# Patient Record
Sex: Female | Born: 1990 | Race: White | Hispanic: No | Marital: Single
Health system: Southern US, Community
[De-identification: ages and names within clinical notes are randomized; demographics above are authoritative.]

---

## 2014-06-07 ENCOUNTER — Other Ambulatory Visit: Payer: Self-pay | Admitting: Family Medicine

## 2014-06-07 DIAGNOSIS — R1011 Right upper quadrant pain: Secondary | ICD-10-CM

## 2014-06-17 ENCOUNTER — Ambulatory Visit
Admission: RE | Admit: 2014-06-17 | Discharge: 2014-06-17 | Disposition: A | Discharge status: Home / Self Care | Payer: BC Managed Care – PPO | Source: Ambulatory Visit | Attending: Family Medicine | Admitting: Family Medicine

## 2014-06-17 DIAGNOSIS — R1011 Right upper quadrant pain: Secondary | ICD-10-CM

## 2015-03-17 IMAGING — US US ABDOMEN COMPLETE
1 series · 14 of 25 positions shown · non-contrast
Comparison: None.

CLINICAL DATA: Intermittent right upper quadrant abdominal pain for
15 years, increasing over the past 3 years. Diagnosed with irritable
bowel syndrome in 5955. Nausea and vomiting and pain after eating.

EXAM:
ULTRASOUND ABDOMEN COMPLETE

[Series 1: us abdomen complete · 0.26mm/px · 14 of 72 slices shown]
[im 1/72]
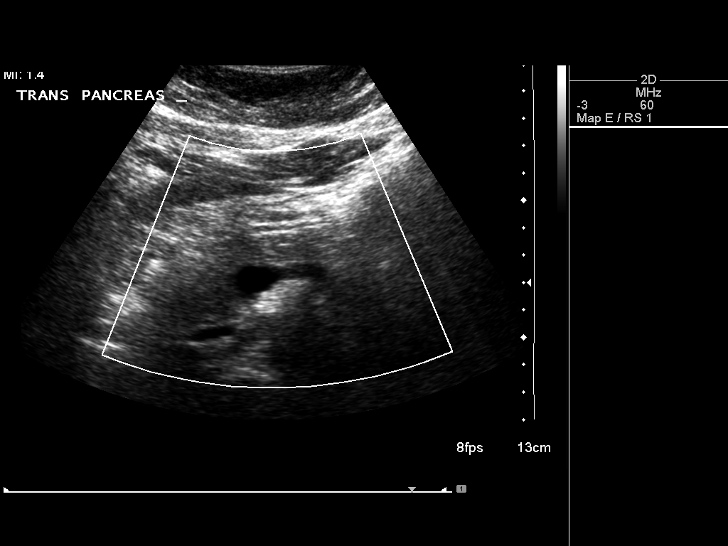
[im 6/72]
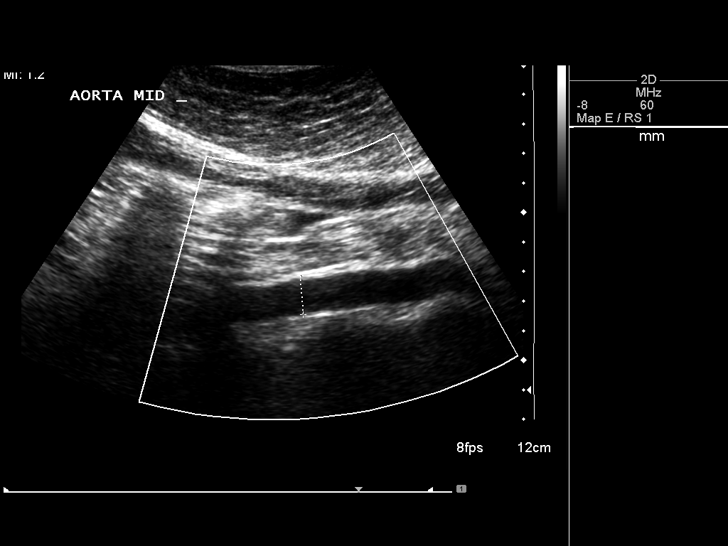
[im 12/72]
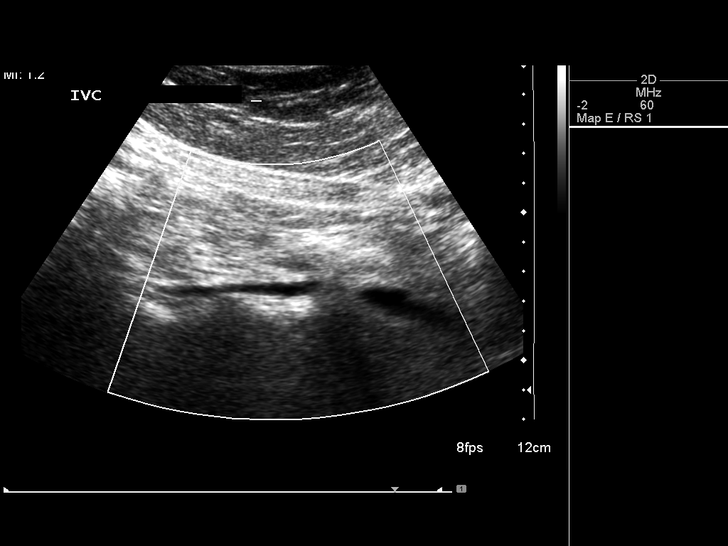
[im 18/72]
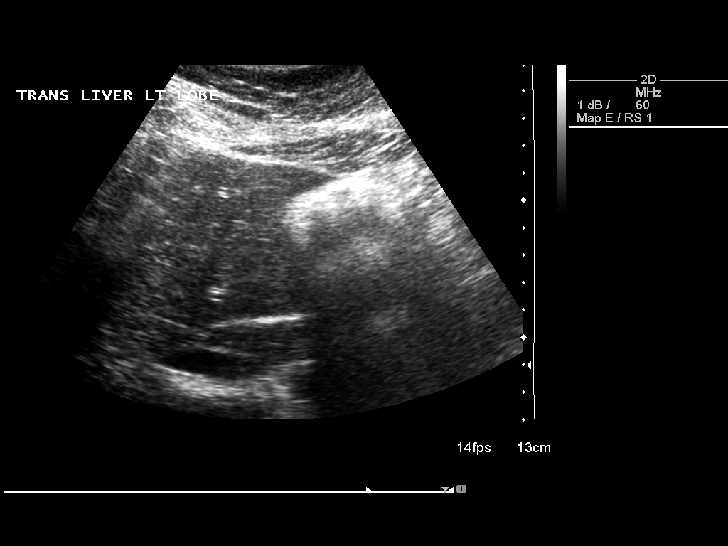
[im 24/72]
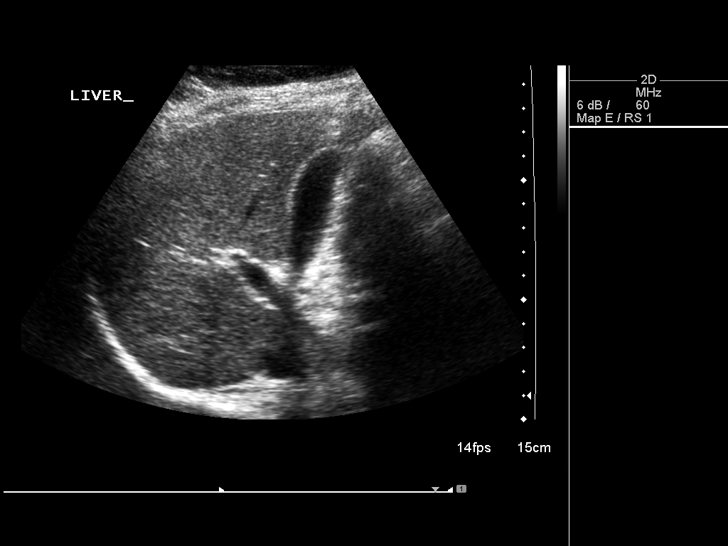
[im 27/72]
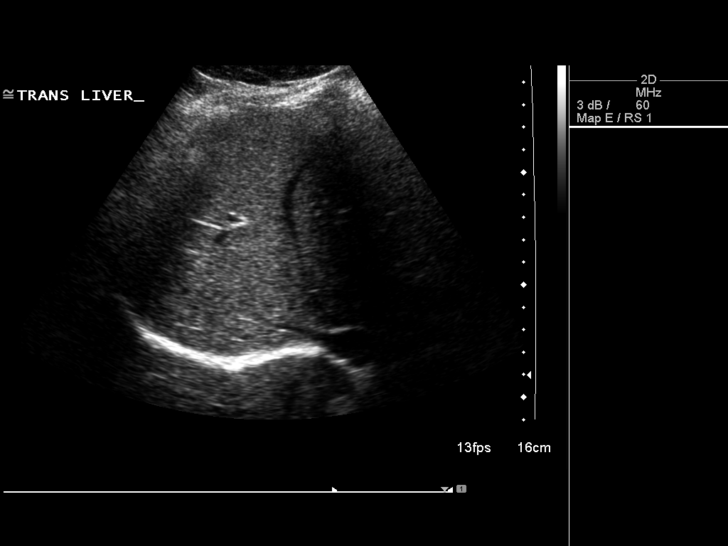
[im 33/72]
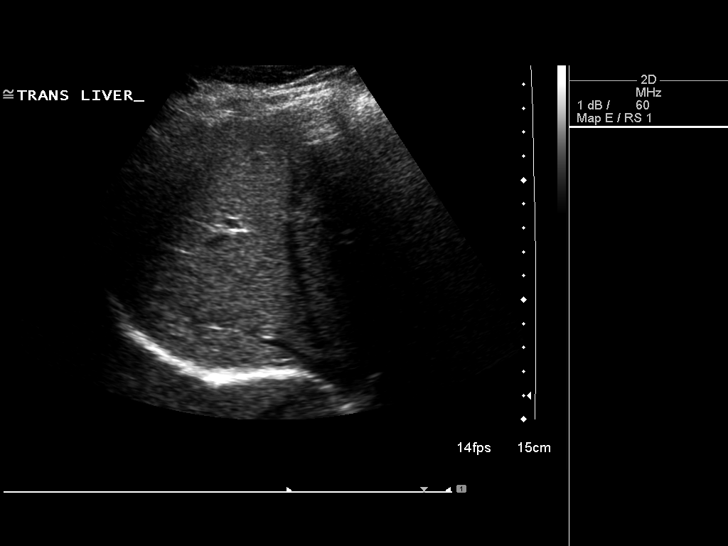
[im 39/72]
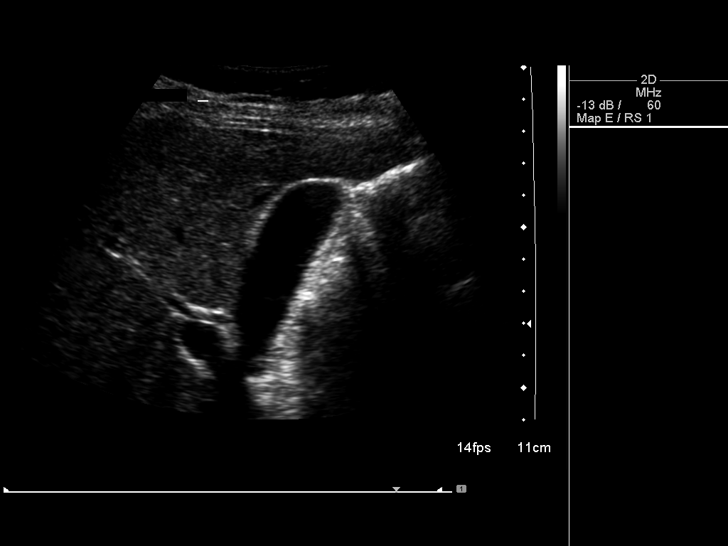
[im 45/72]
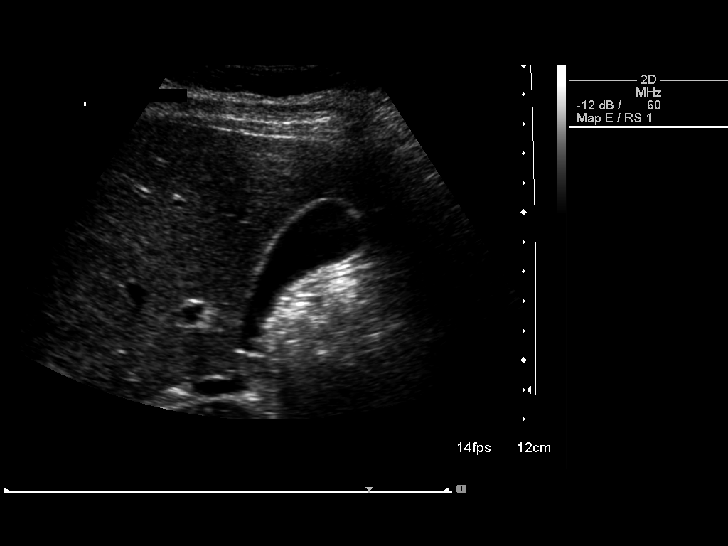
[im 48/72]
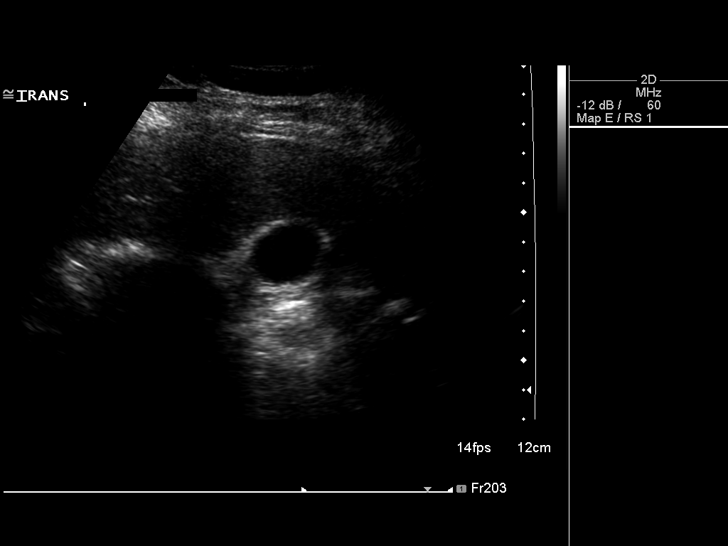
[im 54/72]
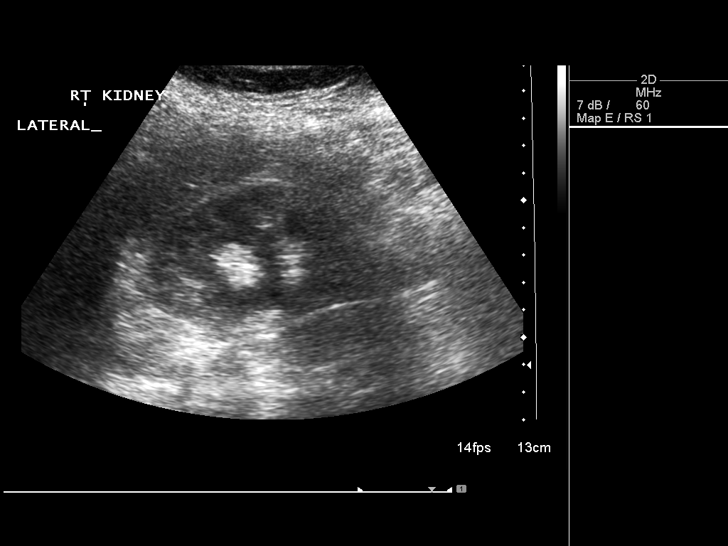
[im 60/72]
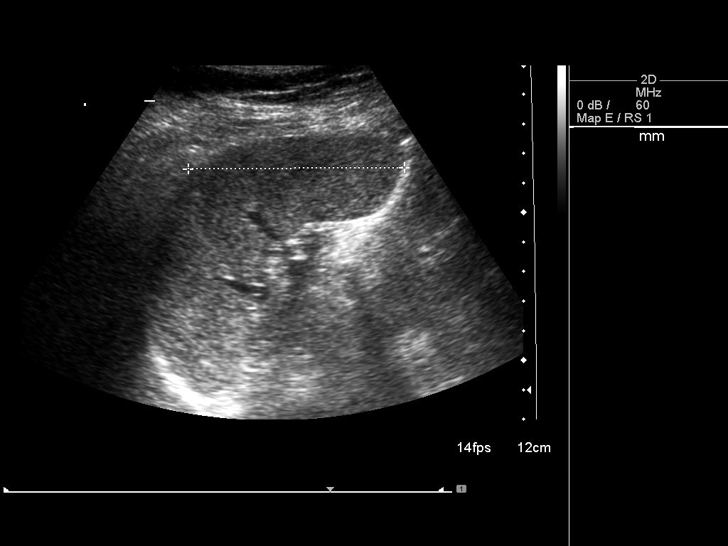
[im 66/72]
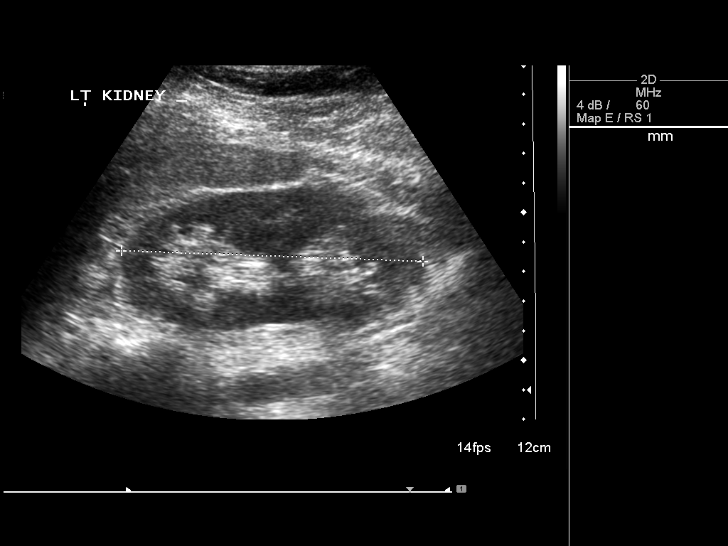
[im 72/72]
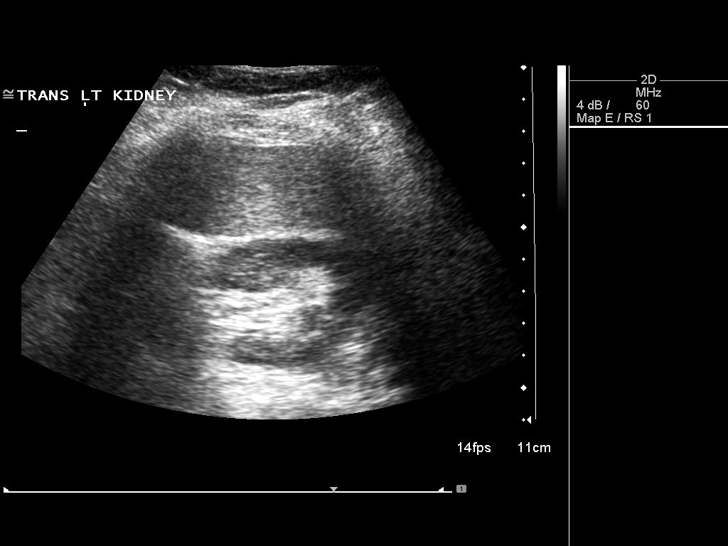

[14 of 25 positions shown; findings below may reference images not displayed]

FINDINGS: Gallbladder: No gallstones or wall thickening visualized. No
sonographic Murphy sign noted.

Common bile duct: Diameter: 1.4 mm

Liver: No focal lesion identified. Within normal limits in
parenchymal echogenicity.

IVC: No abnormality visualized.

Pancreas: Visualized portion unremarkable. Tail not well visualized
due to bowel gas.

Spleen: Size and appearance within normal limits.

Right Kidney: Length: 9.2 cm. Echogenicity within normal limits. No
mass or hydronephrosis visualized.

Left Kidney: Length: 10.2 cm. Echogenicity within normal limits. No
mass or hydronephrosis visualized.

Abdominal aorta: No aneurysm visualized.

Other findings: None.
IMPRESSION: Unremarkable abdominal ultrasound.
